# Patient Record
Sex: Male | Born: 1955 | Race: White | Hispanic: No | Marital: Single
Health system: Southern US, Community
[De-identification: ages and names within clinical notes are randomized; demographics above are authoritative.]

## PROBLEM LIST (undated history)

## (undated) DIAGNOSIS — I639 Cerebral infarction, unspecified: Secondary | ICD-10-CM

## (undated) DIAGNOSIS — I10 Essential (primary) hypertension: Secondary | ICD-10-CM

---

## 2013-02-05 ENCOUNTER — Encounter (HOSPITAL_COMMUNITY): Payer: Self-pay | Admitting: Emergency Medicine

## 2013-02-05 ENCOUNTER — Emergency Department (HOSPITAL_COMMUNITY)
Admission: EM | Admit: 2013-02-05 | Discharge: 2013-02-05 | Disposition: A | Discharge status: Home / Self Care | Payer: Self-pay | Attending: Emergency Medicine | Admitting: Emergency Medicine

## 2013-02-05 ENCOUNTER — Emergency Department (HOSPITAL_COMMUNITY): Payer: Self-pay

## 2013-02-05 DIAGNOSIS — I10 Essential (primary) hypertension: Secondary | ICD-10-CM | POA: Insufficient documentation

## 2013-02-05 DIAGNOSIS — M25561 Pain in right knee: Secondary | ICD-10-CM

## 2013-02-05 DIAGNOSIS — IMO0002 Reserved for concepts with insufficient information to code with codable children: Secondary | ICD-10-CM | POA: Insufficient documentation

## 2013-02-05 DIAGNOSIS — Y9301 Activity, walking, marching and hiking: Secondary | ICD-10-CM | POA: Insufficient documentation

## 2013-02-05 DIAGNOSIS — Y92410 Unspecified street and highway as the place of occurrence of the external cause: Secondary | ICD-10-CM | POA: Insufficient documentation

## 2013-02-05 DIAGNOSIS — Z880 Allergy status to penicillin: Secondary | ICD-10-CM | POA: Insufficient documentation

## 2013-02-05 DIAGNOSIS — S8990XA Unspecified injury of unspecified lower leg, initial encounter: Secondary | ICD-10-CM | POA: Insufficient documentation

## 2013-02-05 DIAGNOSIS — Z8673 Personal history of transient ischemic attack (TIA), and cerebral infarction without residual deficits: Secondary | ICD-10-CM | POA: Insufficient documentation

## 2013-02-05 HISTORY — DX: Cerebral infarction, unspecified: I63.9

## 2013-02-05 HISTORY — DX: Essential (primary) hypertension: I10
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
## 2013-02-11 NOTE — ED Provider Notes (Signed)
Medical screening examination/treatment/procedure(s) were performed by non-physician practitioner and as supervising physician I was immediately available for consultation/collaboration.  EKG Interpretation   None         Camdynn Maranto H Maddox Bratcher, MD 02/11/13 1455 

## 2014-10-03 IMAGING — CR DG KNEE COMPLETE 4+V*L*
4 series · 4 of 4 positions shown · non-contrast
Comparison: None.

CLINICAL DATA: Trauma

EXAM:
LEFT KNEE - COMPLETE 4+ VIEW

[t knee ap left]
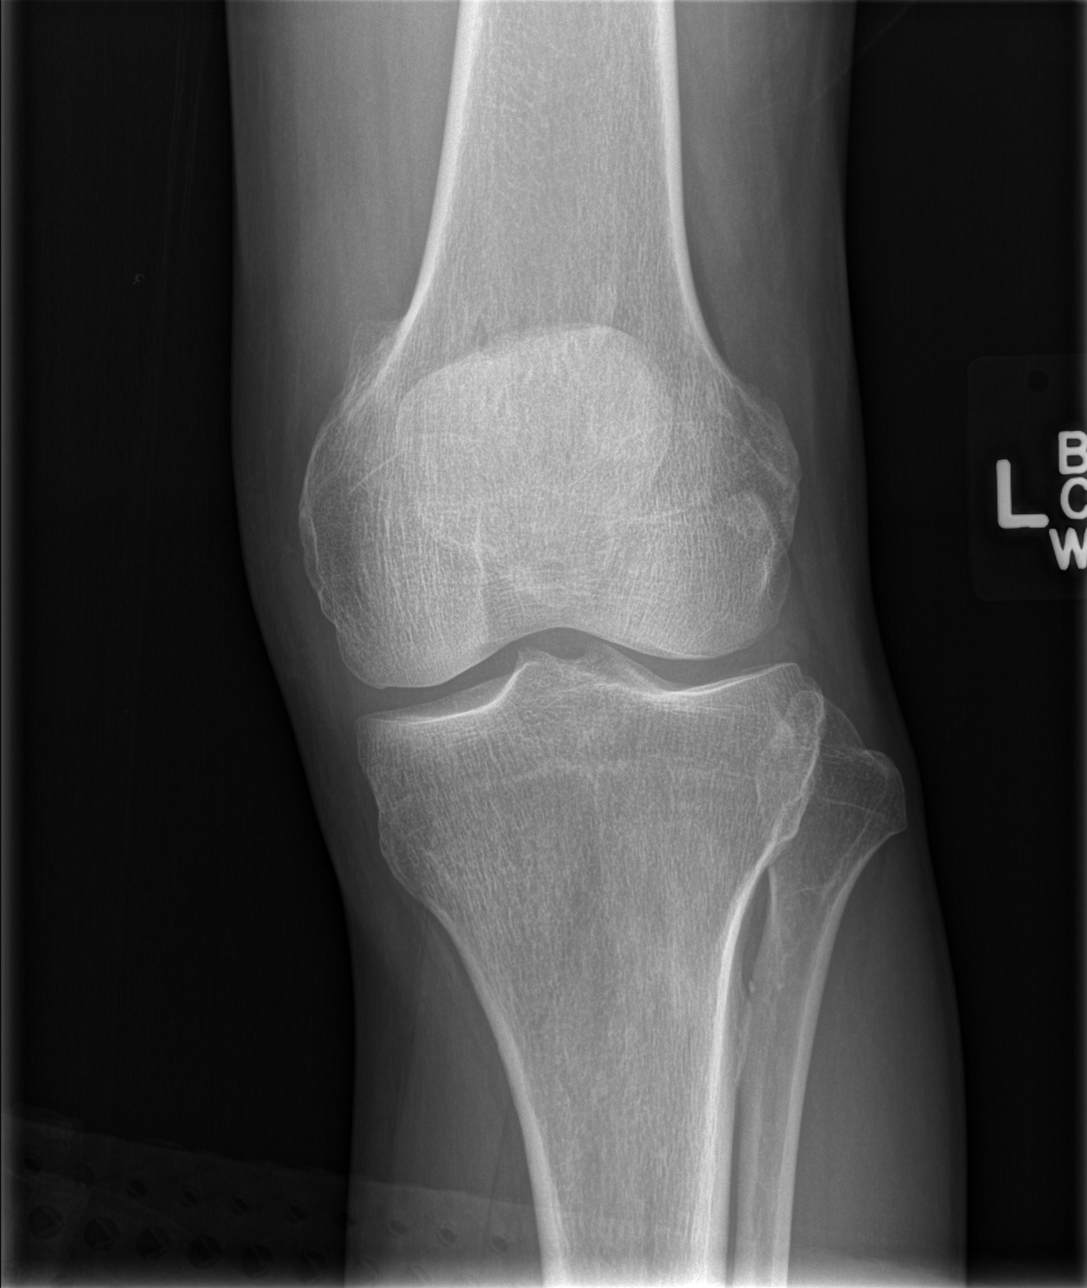

[t knee obl left (1 of 2)]
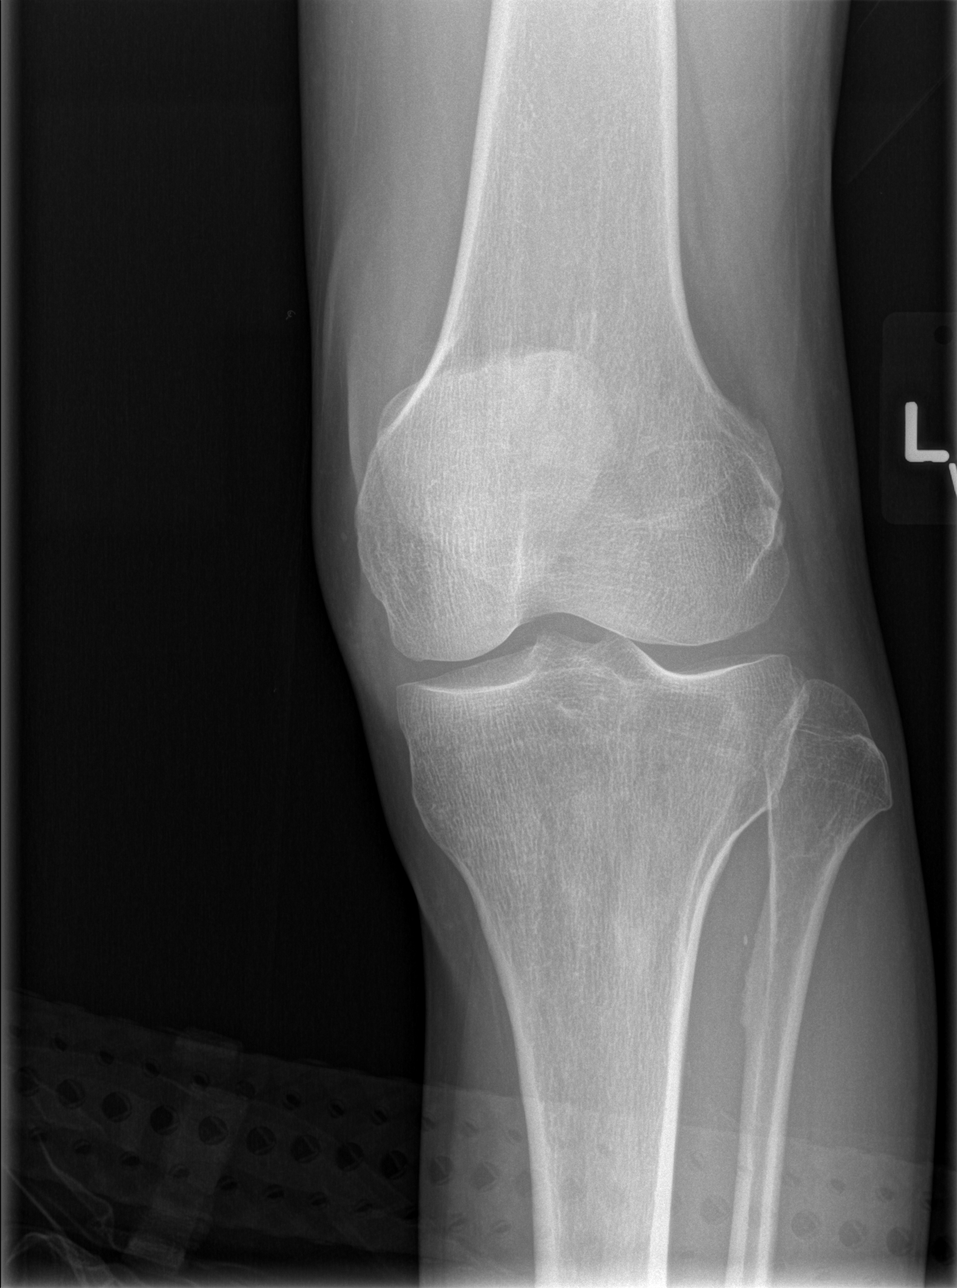

[t knee obl left (2 of 2)]
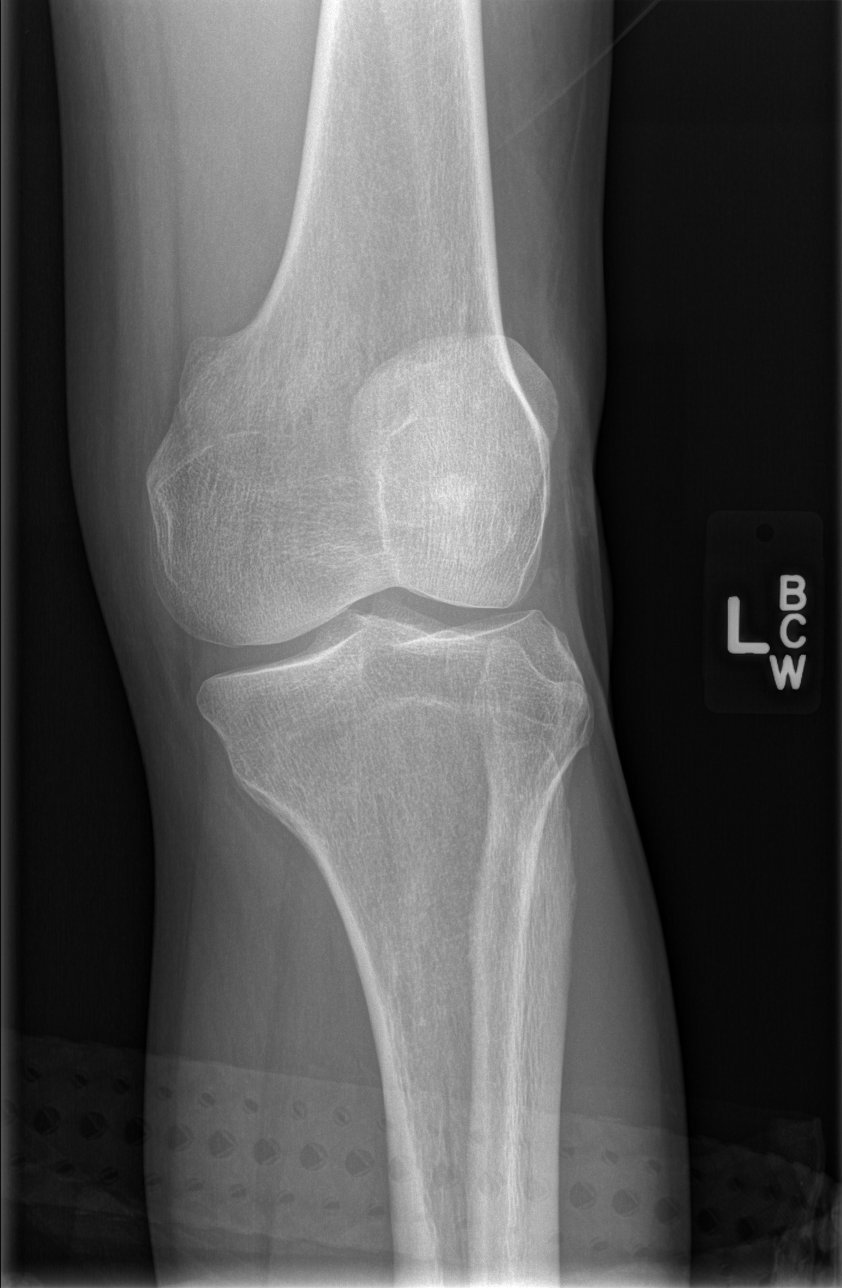

[t knee lat left]
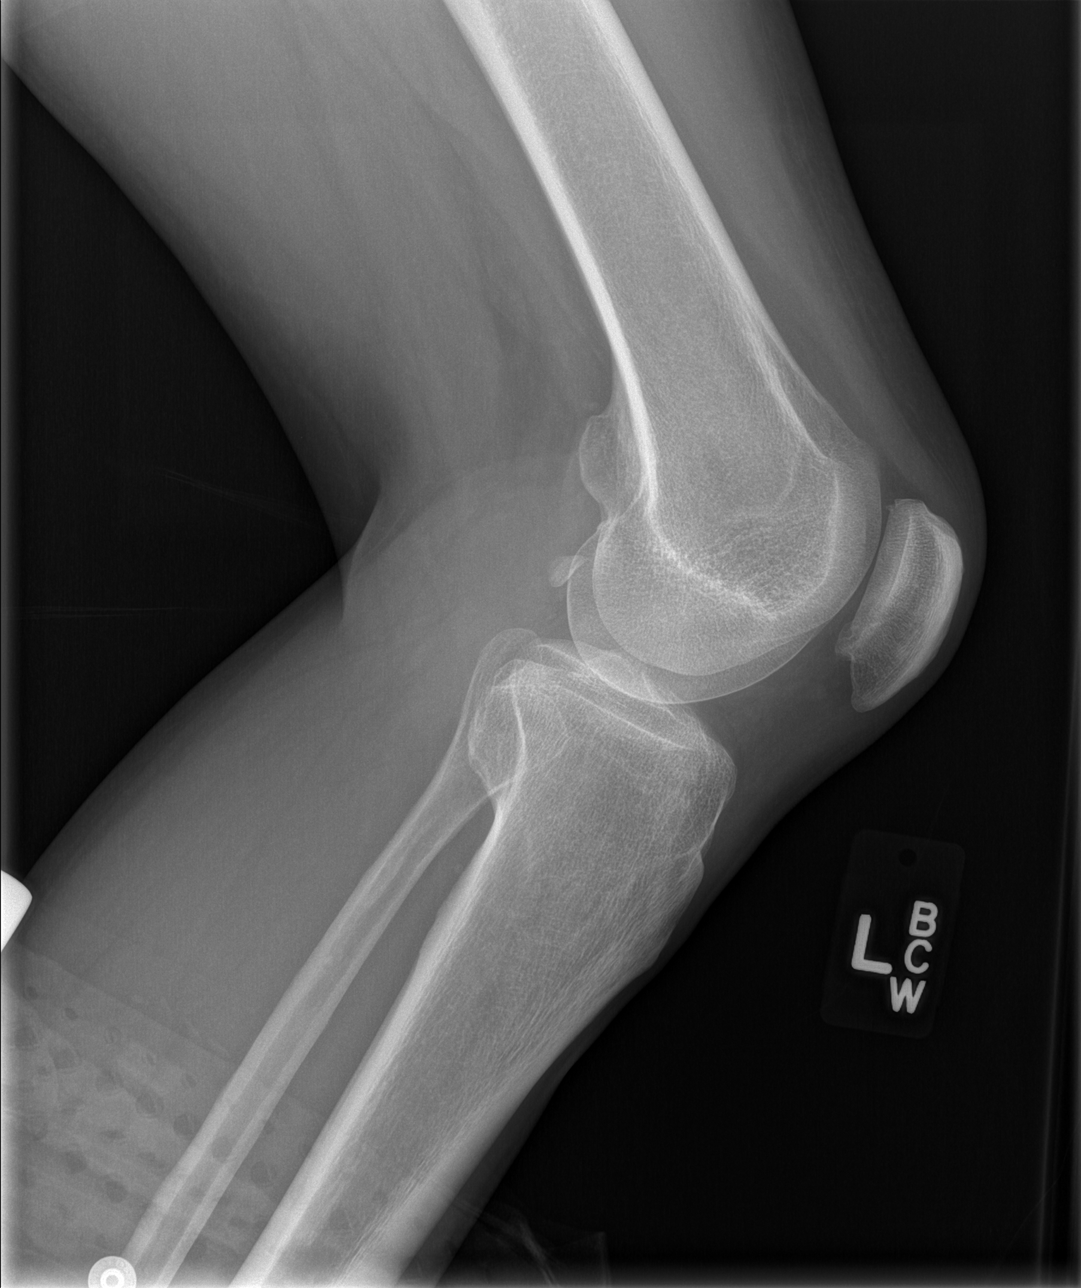

[4 of 4 positions shown; findings below may reference images not displayed]

FINDINGS: Four views of the left knee submitted. No acute fracture or
subluxation. No radiopaque foreign body.
IMPRESSION: Negative.
